# Patient Record
Sex: Male | Born: 1950 | Race: Black or African American | Hispanic: No | Smoking: Former smoker
Health system: Southern US, Community
[De-identification: ages and names within clinical notes are randomized; demographics above are authoritative.]

## PROBLEM LIST (undated history)

## (undated) DIAGNOSIS — C140 Malignant neoplasm of pharynx, unspecified: Secondary | ICD-10-CM

## (undated) DIAGNOSIS — I10 Essential (primary) hypertension: Secondary | ICD-10-CM

## (undated) DIAGNOSIS — C61 Malignant neoplasm of prostate: Secondary | ICD-10-CM

## (undated) HISTORY — PX: GASTROSTOMY TUBE PLACEMENT: SHX655

---

## 2009-07-23 DIAGNOSIS — C61 Malignant neoplasm of prostate: Secondary | ICD-10-CM

## 2009-07-23 HISTORY — DX: Malignant neoplasm of prostate: C61

## 2013-09-10 ENCOUNTER — Emergency Department: Payer: Self-pay | Admitting: Internal Medicine

## 2014-07-17 ENCOUNTER — Emergency Department: Payer: Self-pay | Admitting: Emergency Medicine

## 2014-07-26 ENCOUNTER — Ambulatory Visit: Payer: Self-pay | Admitting: General Practice

## 2014-07-28 ENCOUNTER — Ambulatory Visit: Payer: Self-pay | Admitting: Orthopedic Surgery

## 2014-07-28 DIAGNOSIS — I10 Essential (primary) hypertension: Secondary | ICD-10-CM

## 2014-07-28 LAB — CBC
HCT: 45.2 % (ref 40.0–52.0)
HGB: 14.7 g/dL (ref 13.0–18.0)
MCH: 29.7 pg (ref 26.0–34.0)
MCHC: 32.4 g/dL (ref 32.0–36.0)
MCV: 92 fL (ref 80–100)
Platelet: 268 10*3/uL (ref 150–440)
RBC: 4.93 10*6/uL (ref 4.40–5.90)
RDW: 13.2 % (ref 11.5–14.5)
WBC: 8.5 10*3/uL (ref 3.8–10.6)

## 2014-07-28 LAB — BASIC METABOLIC PANEL
ANION GAP: 5 — AB (ref 7–16)
BUN: 16 mg/dL (ref 7–18)
Calcium, Total: 9.6 mg/dL (ref 8.5–10.1)
Chloride: 101 mmol/L (ref 98–107)
Co2: 29 mmol/L (ref 21–32)
Creatinine: 0.92 mg/dL (ref 0.60–1.30)
Glucose: 97 mg/dL (ref 65–99)
Osmolality: 271 (ref 275–301)
Potassium: 4.1 mmol/L (ref 3.5–5.1)
Sodium: 135 mmol/L — ABNORMAL LOW (ref 136–145)

## 2014-07-29 ENCOUNTER — Ambulatory Visit: Payer: Self-pay | Admitting: Orthopedic Surgery

## 2014-09-08 ENCOUNTER — Ambulatory Visit: Payer: Self-pay | Admitting: Physician Assistant

## 2014-11-15 LAB — SURGICAL PATHOLOGY

## 2014-11-21 NOTE — Op Note (Signed)
PATIENT NAME:  Lawrence Hernandez, Lawrence Hernandez MR#:  [id number] DATE OF BIRTH:  [birth year]  DATE OF PROCEDURE:  07/29/2014  PREOPERATIVE DIAGNOSIS: L1 vertebral compression fracture.   POSTOPERATIVE DIAGNOSIS: L1 vertebral compression fracture.   PROCEDURE: L1 vertebral kyphoplasty.   ANESTHESIA: MAC.   SURGEON: Laurene Footman, MD   DESCRIPTION OF PROCEDURE: The patient was brought to the operating room and after adequate anesthesia was given, he was placed in a prone position. The C-arm was brought in, and both AP and lateral projections with good visualization of the L1 vertebral body were obtained. After patient identification and timeout procedures were completed, the skin was prepped with alcohol and 5 mL of 1% Xylocaine was infiltrated on either side of the L1 vertebral body in the area of the planned incision. The back was then prepped and draped in the usual sterile fashion and a repeat timeout procedure carried out. A spinal needle was used on the right side, getting down to the pedicle, and infiltration of a 50-50 mix of 1% Xylocaine and 0.5% Sensorcaine with epinephrine was infiltrated along the tract. A small incision was made and after adequate, the patient initially felt this after giving this 2 additional minutes for the local to work. Trocar was advanced down to the pedicle and slowly inserted with the use of a small mallet, checking repeated AP and lateral projections to make sure there was no violation of the neural foramen or medial wall of the pedicle to enter the spinal canal. When the trocar entered the vertebral body, a biopsy was obtained. Drilling was carried out, and this crossed the midline. A balloon was inserted and the vertebral body inflated to approximately 4.5 mL. After the cement was at the appropriate consistency, the balloon was removed and the cement was infiltrated into the vertebral body. There was excellent fill of the vertebral body without extravasation with approximately 6. 5  mL filling the vertebral body with good correction of the underlying compression noted. After filling the vertebral body, the trocar was removed and a permanent AP and lateral of L1 was obtained. Gentle pressure was applied to the wound and then Dermabond was applied to the incision, followed by a Band-Aid. The patient was sent to the recovery room in stable condition.   ESTIMATED BLOOD LOSS: Minimal.   COMPLICATIONS: No complications.   SPECIMEN: The L1 vertebral body biopsy.   ____________________________ Laurene Footman, MD mjm:ST D: 07/29/2014 49:17:91 ET T: 07/29/2014 23:38:38 ET JOB#: [id number]  cc: Laurene Footman, MD, <Dictator> Laurene Footman MD ELECTRONICALLY SIGNED 07/31/2014 8:20

## 2015-11-17 ENCOUNTER — Other Ambulatory Visit: Payer: Self-pay | Admitting: Otolaryngology

## 2015-11-17 DIAGNOSIS — C029 Malignant neoplasm of tongue, unspecified: Secondary | ICD-10-CM

## 2015-11-22 ENCOUNTER — Other Ambulatory Visit (HOSPITAL_COMMUNITY): Payer: Self-pay | Admitting: Otolaryngology

## 2015-11-22 DIAGNOSIS — C029 Malignant neoplasm of tongue, unspecified: Secondary | ICD-10-CM

## 2015-11-24 ENCOUNTER — Ambulatory Visit: Payer: BLUE CROSS/BLUE SHIELD

## 2015-11-24 ENCOUNTER — Ambulatory Visit
Admission: RE | Admit: 2015-11-24 | Discharge: 2015-11-24 | Disposition: A | Discharge status: Home / Self Care | Payer: BLUE CROSS/BLUE SHIELD | Source: Ambulatory Visit | Attending: Otolaryngology | Admitting: Otolaryngology

## 2015-11-24 DIAGNOSIS — M47892 Other spondylosis, cervical region: Secondary | ICD-10-CM | POA: Diagnosis not present

## 2015-11-24 DIAGNOSIS — R599 Enlarged lymph nodes, unspecified: Secondary | ICD-10-CM | POA: Diagnosis not present

## 2015-11-24 DIAGNOSIS — J439 Emphysema, unspecified: Secondary | ICD-10-CM | POA: Insufficient documentation

## 2015-11-24 DIAGNOSIS — E041 Nontoxic single thyroid nodule: Secondary | ICD-10-CM | POA: Diagnosis not present

## 2015-11-24 DIAGNOSIS — C029 Malignant neoplasm of tongue, unspecified: Secondary | ICD-10-CM | POA: Insufficient documentation

## 2015-11-24 HISTORY — DX: Essential (primary) hypertension: I10

## 2015-11-24 HISTORY — DX: Malignant neoplasm of prostate: C61

## 2015-11-24 LAB — POCT I-STAT CREATININE: CREATININE: 1 mg/dL (ref 0.61–1.24)

## 2015-11-24 MED ORDER — IOPAMIDOL (ISOVUE-300) INJECTION 61%
75.0000 mL | Freq: Once | INTRAVENOUS | Status: AC | PRN
  Administered 2015-11-24: 75 mL via INTRAVENOUS

## 2016-02-02 IMAGING — MR MRI LUMBAR SPINE WITHOUT CONTRAST
4 of 5 series · 12 of 48 positions shown · non-contrast
Comparison: CT lumbar spine 07/17/2014, and earlier.

CLINICAL DATA: 63-year-old male fell on the kitchen floor [place]. Low back pain increasing with standing or sitting. L1 vertebral
body fracture diagnosed 07/17/2014. Initial encounter.

EXAM:
MRI LUMBAR SPINE WITHOUT CONTRAST
TECHNIQUE: Multiplanar, multisequence MR imaging of the lumbar spine was
performed. No intravenous contrast was administered.

[Series 2: T2 · sagittal · 4.0mm · 0.36mm/px · 3 of 17 slices shown (1 of 2)]
[im 4/17]
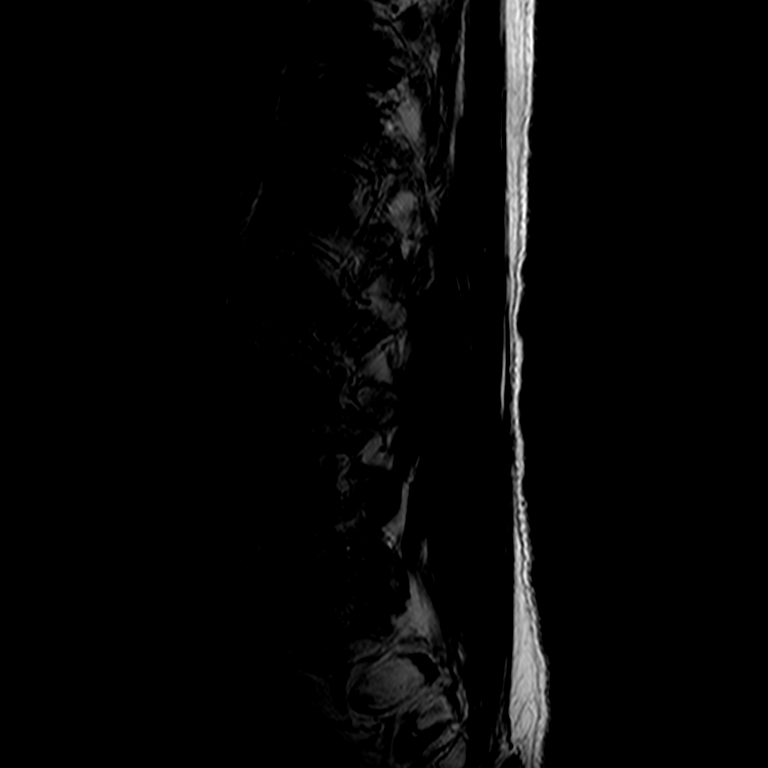
[im 10/17]
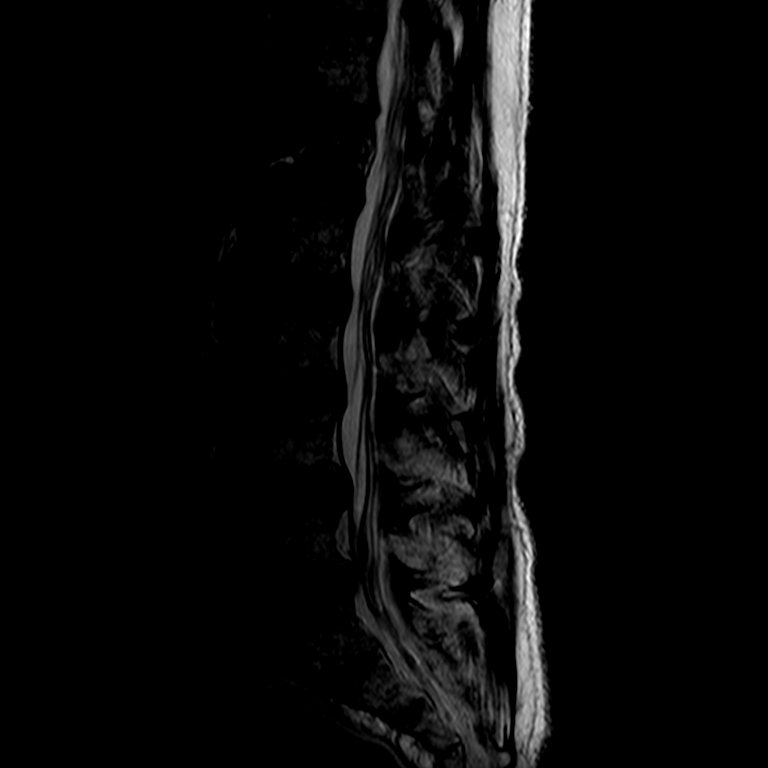
[im 17/17]
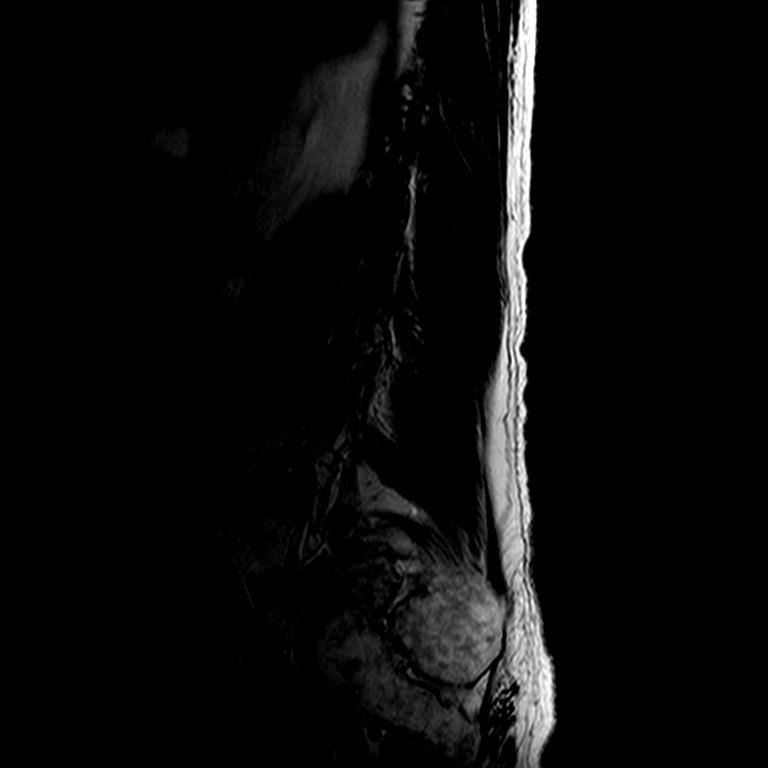

[Series 3: T1 · sagittal · 4.0mm · 0.44mm/px · 3 of 17 slices shown (1 of 2)]
[im 4/17]
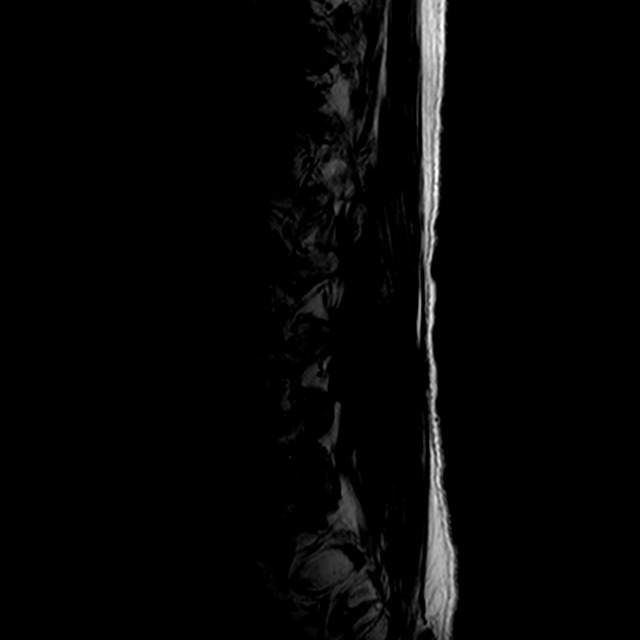
[im 10/17]
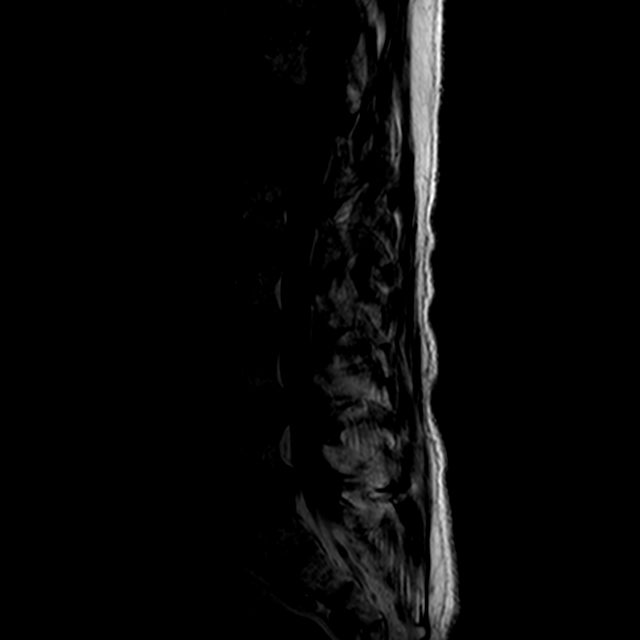
[im 17/17]
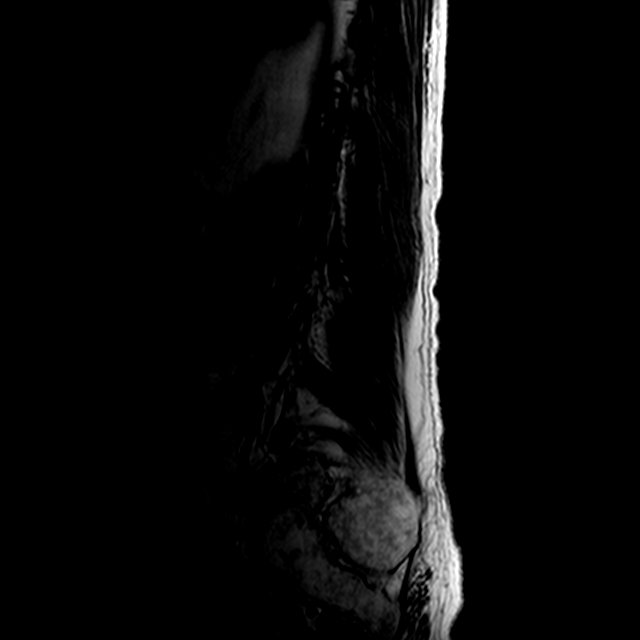

[Series 5: T2 · axial · 4.0mm · 0.39mm/px · z∈[-124,+46]mm · 3 of 39 slices shown (2 of 2)]
[im 6/39]
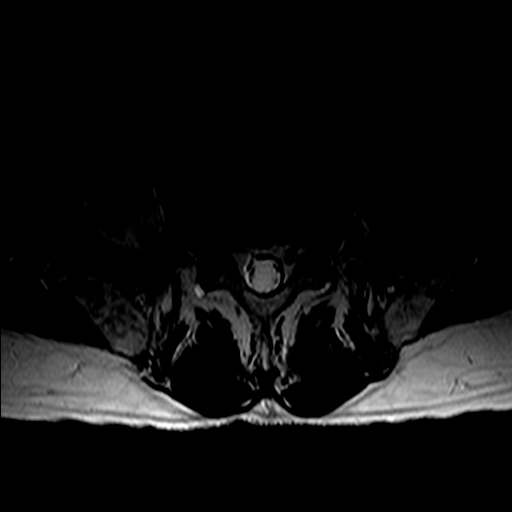
[im 20/39]
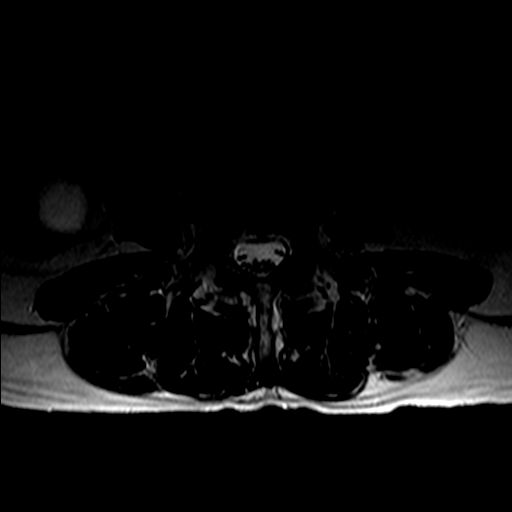
[im 33/39]
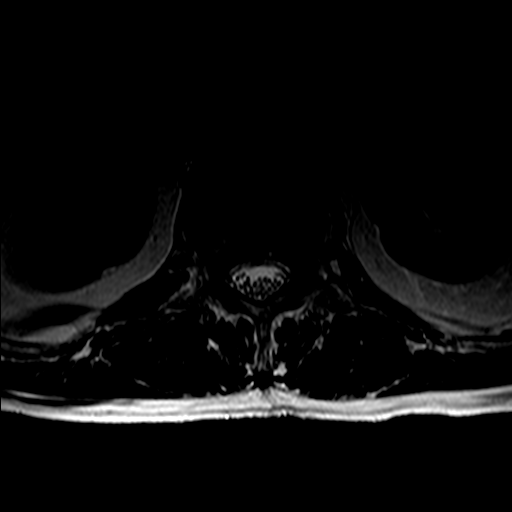

[Series 6: T1 · axial · 4.0mm · 0.39mm/px · z∈[-124,+46]mm · 3 of 39 slices shown (2 of 2)]
[im 6/39]
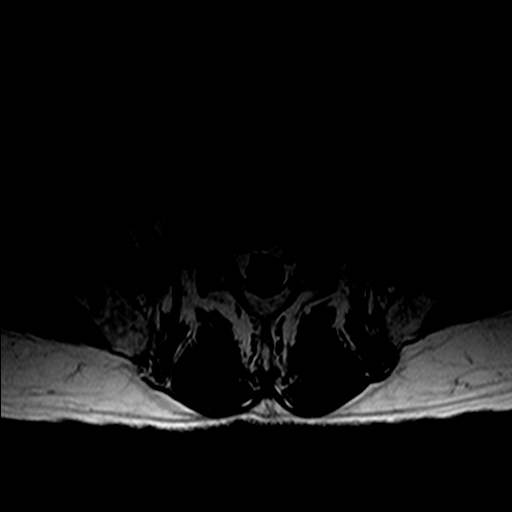
[im 20/39]
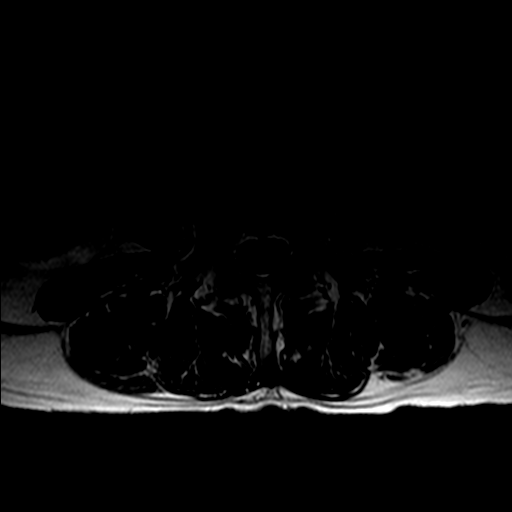
[im 33/39]
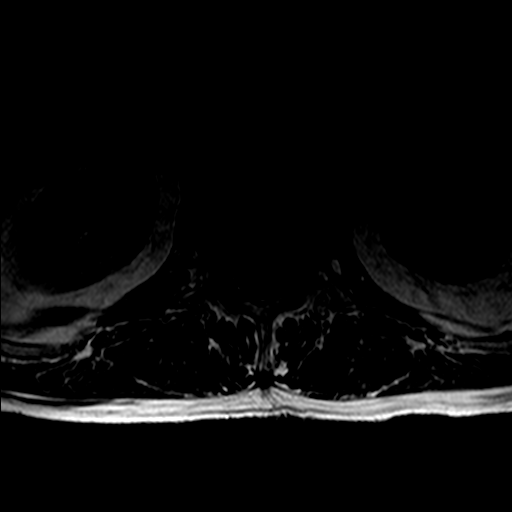

[12 of 48 positions shown; findings below may reference images not displayed]

FINDINGS: Same numbering system as on 07/17/2014.

L1 vertebral body loss of height and marrow edema in conjunction
with the comminuted vertebral body fracture demonstrated on the
comparison. Mild retropulsion of bone re- identified, but without
significant spinal stenosis as the AP thecal sac dimension is
maintained at 12 mm or greater in the region. Marrow edema does
mildly tract and a both pedicles, more so the right. Other L1
posterior elements appear intact. Loss of height is up to 60%. Small
volume of surrounding paraspinal soft tissue edema or hematoma best
seen on series 5, image 5. No epidural blood identified.

The remaining visualized thoracic, lumbar levels, and visible sacrum
are intact. No other marrow edema.

There is partially visible abnormal midline presacral signal which
is ovoid are triangular in appearance. This does not appear related
to the exiting sacral nerve roots, or pelvic lymph node stations. It
might communicate with presacral pelvic veins, uncertain. No
retroperitoneal lymphadenopathy. The abnormal area encompasses 42 x
16 mm (cc by AP).

Negative visualized abdominal viscera ; simple appearing left renal
lower pole cyst. Negative visualized posterior paraspinal soft
tissues.

Visualized lower thoracic spinal cord is normal with conus medularis
at T12-L1.

Lumbar spine degenerative changes are noted, but with mild if any
associated lumbar spinal stenosis (L2-L3 level series 5, image 16).
IMPRESSION: 1. Acute/subacute comminuted L1 vertebral body fracture with mild
retropulsion of bone. Marrow edema tracks into both pedicles. Small
volume prevertebral edema or hematoma. No associated spinal
stenosis.
2. No other acute findings in the lumbar spine. Borderline to mild
degenerative spinal stenosis at L2-L3.
3. Abnormal but nonspecific signal in the presacral space. Benign
etiology is favored (such as presacral extramedullary hematopoiesis
or myelo-lipoma type lesion, less likely benign neurogenic lesion)
but recommend followup CT Abdomen and Pelvis with oral and IV
contrast to fully visualize and further characterize.

## 2016-03-12 ENCOUNTER — Encounter: Payer: Self-pay | Admitting: Emergency Medicine

## 2016-03-12 ENCOUNTER — Emergency Department
Admission: EM | Admit: 2016-03-12 | Discharge: 2016-03-12 | Discharge status: Against Medical Advice | Payer: BLUE CROSS/BLUE SHIELD | Attending: Emergency Medicine | Admitting: Emergency Medicine

## 2016-03-12 DIAGNOSIS — K9423 Gastrostomy malfunction: Secondary | ICD-10-CM | POA: Insufficient documentation

## 2016-03-12 DIAGNOSIS — Z8546 Personal history of malignant neoplasm of prostate: Secondary | ICD-10-CM | POA: Diagnosis not present

## 2016-03-12 DIAGNOSIS — I10 Essential (primary) hypertension: Secondary | ICD-10-CM | POA: Insufficient documentation

## 2016-03-12 DIAGNOSIS — Z87891 Personal history of nicotine dependence: Secondary | ICD-10-CM | POA: Insufficient documentation

## 2016-03-12 DIAGNOSIS — Z85818 Personal history of malignant neoplasm of other sites of lip, oral cavity, and pharynx: Secondary | ICD-10-CM | POA: Insufficient documentation

## 2016-03-12 DIAGNOSIS — T85528A Displacement of other gastrointestinal prosthetic devices, implants and grafts, initial encounter: Secondary | ICD-10-CM

## 2016-03-12 DIAGNOSIS — Z431 Encounter for attention to gastrostomy: Secondary | ICD-10-CM

## 2016-03-12 HISTORY — DX: Malignant neoplasm of pharynx, unspecified: C14.0

## 2016-03-12 NOTE — ED Provider Notes (Signed)
[facility] Emergency Department Provider Note   ____________________________________________   First MD Initiated Contact with Patient 03/12/16 0114     (approximate)  I have reviewed the triage vital signs and the nursing notes.   HISTORY  Chief Complaint Other    HPI Lawrence Hernandez is a 65 y.o. male who comes into the hospital today after his G-tube fell out. The patient reports that he has throat cancer and although he is able to take fluids by mouth he has a G-tube to help with his intake. He reports that he's had it in for 2 months and it has popped out tonight. He reports he was given a go to [facility] but he wanted [place] to look at it first. He reports that he does not put food in the tube and it's a little sore but he has no significant pain. The patient is here for evaluation.   Past Medical History:  Diagnosis Date  . Hypertension   . Prostate cancer ([place]) 2011   Laser tx.  . Throat cancer ([place])     There are no active problems to display for this patient.   Past Surgical History:  Procedure Laterality Date  . GASTROSTOMY TUBE PLACEMENT      Prior to Admission medications   Not on File    Allergies Penicillins  No family history on file.  Social History Social History  Substance Use Topics  . Smoking status: Former Research scientist (life sciences)  . Smokeless tobacco: Not on file  . Alcohol use No    Review of Systems Constitutional: No fever/chills Eyes: No visual changes. ENT: No sore throat. Cardiovascular: Denies chest pain. Respiratory: Denies shortness of breath. Gastrointestinal: No abdominal pain.  No nausea, no vomiting.  No diarrhea.  No constipation. Genitourinary: Negative for dysuria. Musculoskeletal: Negative for back pain. Skin: Negative for rash. Neurological: Negative for headaches, focal weakness or numbness.  10-point ROS otherwise negative.  ____________________________________________   PHYSICAL EXAM:  VITAL  SIGNS: ED Triage Vitals  Enc Vitals Group     BP 03/12/16 0023 123/69     Pulse Rate 03/12/16 0023 95     Resp 03/12/16 0023 18     Temp 03/12/16 0023 98.3 F (36.8 C)     Temp Source 03/12/16 0023 Oral     SpO2 03/12/16 0023 100 %     Weight 03/12/16 0018 128 lb (58.1 kg)     Height 03/12/16 0018 5\' 11"  (1.803 m)     Head Circumference --      Peak Flow --      Pain Score --      Pain Loc --      Pain Edu? --      Excl. in Uncertain? --     Constitutional: Alert and oriented. Well appearing and in no acute distress. Eyes: Conjunctivae are normal. PERRL. EOMI. Head: Atraumatic. Nose: No congestion/rhinnorhea. Mouth/Throat: Mucous membranes are moist.  Oropharynx non-erythematous. Cardiovascular: Normal rate, regular rhythm. Grossly normal heart sounds.  Good peripheral circulation. Respiratory: Normal respiratory effort.  No retractions. Lungs CTAB. Gastrointestinal: Soft and nontender. No distention. G-tube displaced, stoma is patent Musculoskeletal: No lower extremity tenderness nor edema.   Neurologic:  Normal speech and language.  Skin:  Skin is warm, dry and intact.  Psychiatric: Mood and affect are normal.   ____________________________________________   LABS (all labs ordered are listed, but only abnormal results are displayed)  Labs Reviewed - No data to display ____________________________________________  EKG  none ____________________________________________  RADIOLOGY  none ____________________________________________   PROCEDURES  Procedure(s) performed: None  Procedures  Critical Care performed: No  ____________________________________________   INITIAL IMPRESSION / ASSESSMENT AND PLAN / ED COURSE  Pertinent labs & imaging results that were available during my care of the patient were reviewed by me and considered in my medical decision making (see chart for details).  This is a 65 year old male who comes into the hospital today after his  G-tube fell out. He has an 45 [place] Foley G-tube in place at this time. I will attempt to replace it myself. I will then to a G-tube study. The patient will be reassessed.  Clinical Course   I did attempt to replace the patient's G-tube with an 21 French tube that we had here. I was able to pass the tip of the tube into the patient's stoma but could not pass it any further without meeting some significant resistance. I did offer to contact surgery to assist me in placing this tube. The patient reports that he just wanted [place] to look at it to ensure that he was not bleeding out. He reports that he would rather go back to [facility] and have them replace it there. I informed the patient that we could have our surgeons or even our interventional radiologist evaluate and replace the tube but he reports that he rather go to [facility]. The patient will sign out [place] and go to [facility] to have his G-tube replaced.  ____________________________________________   FINAL CLINICAL IMPRESSION(S) / ED DIAGNOSES  Final diagnoses:  Dislodged gastrostomy tube ([place])      NEW MEDICATIONS STARTED DURING THIS VISIT:  There are no discharge medications for this patient.    Note:  This document was prepared using Dragon voice recognition software and may include unintentional dictation errors.    Loney Hering, MD 03/12/16 [phone]

## 2016-03-12 NOTE — ED Triage Notes (Signed)
Patient reports his feeding tube came out approximately 10 minutes prior to arrival.

## 2016-03-12 NOTE — ED Notes (Signed)
Pt found in room att 

## 2016-09-20 DEATH — deceased
